# Patient Record
Sex: Female | Born: 1985 | Race: White | Hispanic: No | Marital: Married | Smoking: Former smoker
Health system: Southern US, Community
[De-identification: ages and names within clinical notes are randomized; demographics above are authoritative.]

## PROBLEM LIST (undated history)

## (undated) DIAGNOSIS — F319 Bipolar disorder, unspecified: Secondary | ICD-10-CM

## (undated) DIAGNOSIS — Z9109 Other allergy status, other than to drugs and biological substances: Secondary | ICD-10-CM

## (undated) DIAGNOSIS — IMO0001 Reserved for inherently not codable concepts without codable children: Secondary | ICD-10-CM

## (undated) DIAGNOSIS — I498 Other specified cardiac arrhythmias: Secondary | ICD-10-CM

## (undated) DIAGNOSIS — G90A Postural orthostatic tachycardia syndrome (POTS): Secondary | ICD-10-CM

## (undated) DIAGNOSIS — R000 Tachycardia, unspecified: Secondary | ICD-10-CM

## (undated) DIAGNOSIS — I951 Orthostatic hypotension: Secondary | ICD-10-CM

## (undated) HISTORY — PX: TONSILLECTOMY: SUR1361

## (undated) HISTORY — DX: Bipolar disorder, unspecified: F31.9

## (undated) HISTORY — DX: Orthostatic hypotension: I95.1

## (undated) HISTORY — DX: Other specified cardiac arrhythmias: I49.8

## (undated) HISTORY — DX: Postural orthostatic tachycardia syndrome (POTS): G90.A

## (undated) HISTORY — DX: Tachycardia, unspecified: R00.0

## (undated) HISTORY — DX: Other allergy status, other than to drugs and biological substances: Z91.09

## (undated) HISTORY — DX: Reserved for inherently not codable concepts without codable children: IMO0001

---

## 2009-03-09 ENCOUNTER — Encounter: Payer: Self-pay | Admitting: Cardiology

## 2009-04-09 ENCOUNTER — Encounter: Payer: Self-pay | Admitting: Cardiology

## 2009-05-24 ENCOUNTER — Ambulatory Visit: Payer: Self-pay | Admitting: Cardiology

## 2009-05-24 DIAGNOSIS — R002 Palpitations: Secondary | ICD-10-CM

## 2009-05-24 DIAGNOSIS — O9989 Other specified diseases and conditions complicating pregnancy, childbirth and the puerperium: Secondary | ICD-10-CM

## 2009-05-24 DIAGNOSIS — G930 Cerebral cysts: Secondary | ICD-10-CM

## 2009-05-24 DIAGNOSIS — R55 Syncope and collapse: Secondary | ICD-10-CM

## 2009-05-25 ENCOUNTER — Encounter (INDEPENDENT_AMBULATORY_CARE_PROVIDER_SITE_OTHER): Payer: Self-pay | Admitting: *Deleted

## 2009-05-31 ENCOUNTER — Encounter: Payer: Self-pay | Admitting: Cardiology

## 2009-05-31 ENCOUNTER — Ambulatory Visit: Payer: Self-pay | Admitting: Cardiology

## 2009-06-07 ENCOUNTER — Encounter: Payer: Self-pay | Admitting: Cardiology

## 2009-06-09 ENCOUNTER — Encounter (INDEPENDENT_AMBULATORY_CARE_PROVIDER_SITE_OTHER): Payer: Self-pay | Admitting: *Deleted

## 2009-06-27 ENCOUNTER — Encounter: Payer: Self-pay | Admitting: Cardiology

## 2009-07-06 ENCOUNTER — Ambulatory Visit: Payer: Self-pay | Admitting: Cardiology

## 2009-10-17 ENCOUNTER — Encounter: Payer: Self-pay | Admitting: Cardiology

## 2009-10-26 ENCOUNTER — Encounter: Payer: Self-pay | Admitting: Cardiology

## 2009-12-08 ENCOUNTER — Ambulatory Visit: Payer: Self-pay | Admitting: Cardiology

## 2010-02-15 NOTE — Assessment & Plan Note (Signed)
Summary: 3 MO FUL   Visit Type:  Follow-up Primary Majed Pellegrin:  Tanja Port, FNP   History of Present Illness: Got C section,  breast feeding with beta blocker. 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 Stopped breast feeding. Fast heart rates better. Standing up feels dizzy. Sometimes hot flashes.   the patient was previously evaluated for episodes of syncope and a prior history of cardiac murmur with possible small VSD. However an echocardiogram failed to demonstrate VSD and the patient normal LV function with no structural abnormalities. Clinically prepartum she did appear to have POTS. We encouraged fluid intake and eventually the third trimester we started her on beta blocker therapy. Clinically this has helped her significantly but as outlined above she's continued use of beta blocker while breast-feeding. There was a baby developed some hypoglycemia from this she will somewhat small at birth with some concern of IUG retardation. However the baby is developing normally now.  Preventive Screening-Counseling & Management  Alcohol-Tobacco     Smoking Status: quit < 6 months  Comments: Quit smoking on October 17, 2009  Current Medications (verified): 1)  Metoprolol Succinate 25 Mg Xr24h-Tab (Metoprolol Succinate) .... Take 1 Tablet By Mouth Once A Day 2)  Tylenol Extra Strength 500 Mg Tabs (Acetaminophen) .... As Needed 3)  Midodrine Hcl 2.5 Mg Tabs (Midodrine Hcl) .... Take 1 Tablet By Mouth Two Times A Day  Allergies: 1)  ! Pcn 2)  ! * Ivp Dye 3)  ! Keflex  Comments:  Nurse/Medical Assistant: The patient's medications were reviewed with the patient and were updated in the Medication List. Pt verbally confirmed medications.  Cyril Loosen, RN, BSN (December 08, 2009 10:25 AM)  Past History:  Past Surgical History: Last updated: 05/24/2009 Tonsillectomy  Family History: Last updated: 05/24/2009 noncontributory  Social History: Last updated: 05/24/2009 Tobacco Use - Yes.    Risk Factors: Smoking Status: quit < 6 months (12/08/2009) Packs/Day: 1 PPD (07/06/2009)  Past Medical History: Asthma Enviromental Allergies normal echocardiogram with no evidence of VSD and normal LV function POTS  Social History: Smoking Status:  quit < 6 months  Review of Systems  The patient denies fatigue, malaise, fever, weight gain/loss, vision loss, decreased hearing, hoarseness, chest pain, shortness of breath, prolonged cough, wheezing, sleep apnea, coughing up blood, abdominal pain, blood in stool, nausea, vomiting, diarrhea, heartburn, incontinence, blood in urine, muscle weakness, joint pain, leg swelling, rash, skin lesions, headache, fainting, depression, anxiety, enlarged lymph nodes, easy bruising or bleeding, environmental allergies, palpitations, and dizziness.    Vital Signs:  Patient profile:   25 year old female Height:      67 inches Weight:      257 pounds Pulse rate:   76 / minute Pulse (ortho):   96 / minute BP sitting:   109 / 67  (left arm) BP standing:   106 / 74 Cuff size:   large  Vitals Entered By: Cyril Loosen, RN, BSN (December 08, 2009 10:22 AM)  Serial Vital Signs/Assessments:  Time      Position  BP       Pulse  Resp  Temp     By 11:23 AM  Lying RA  115/77   [address], LPN 78:46 AM  Sitting   118/80   76                    Inocencio Homes  Hill, LPN 95:28 AM  Standing  106/74   [address] LPN  Comments: 41:32 AM after 3 min:  102/72  99 after 5 min:  109/77  91  By: Hoover Brunette, LPN   Comments Follow up visit. Pt states occasional episodes near-syncope. Denies any syncope. States she forgot to take her meds for a couple of days and near-syncope was worse.    Physical Exam  Additional Exam:  General: Well-developed, well-nourished in no distress head: Normocephalic and atraumatic eyes PERRLA/EOMI intact, conjunctiva and lids normal nose: No deformity or lesions mouth normal dentition,  normal posterior pharynx neck: Supple, no JVD.  No masses, thyromegaly or abnormal cervical nodes lungs: Normal breath sounds bilaterally without wheezing.  Normal percussion heart: regular rate and rhythm with normal S1 and S2, no S3 or S4.  PMI is normal.  No pathological murmurs abdomen: Normal bowel sounds, abdomen is soft and nontender without masses, organomegaly or hernias noted.  No hepatosplenomegaly. Abdomen distended consistent with normal pregnancy musculoskeletal: Back normal, normal gait muscle strength and tone normal pulsus: Pulse is normal in all 4 extremities Extremities: No peripheral pitting edema neurologic: Alert and oriented x 3 skin: Intact without lesions or rashes cervical nodes: No significant adenopathy psychologic: Normal affect    Impression & Recommendations:  Problem # 1:  PREGNANCY, NORMAL (ICD-V22.2) the patient status post delivery.  Problem # 2:  SYNCOPE AND COLLAPSE (ICD-780.2) the patient was still orthostatic in the office today with a significant increase in heart rate and symptoms of dizziness when sitting up. It was also dropped her blood pressure of 10 points. A strong recommend patient increase her fluid intake we will continue the low dose beta blocker I will also add a prescription of Midodrin 2.5 mg p.o. b.i.d. again I made it very clear to the patient that she cannot breast-feed any of these medications. Her updated medication list for this problem includes:    Metoprolol Succinate 25 Mg Xr24h-tab (Metoprolol succinate) .[name]... Take 1 tablet by mouth once a day  Problem # 3:  PALPITATIONS (ICD-785.1) improve significantly on metoprolol. Her updated medication list for this problem includes:    Metoprolol Succinate 25 Mg Xr24h-tab (Metoprolol succinate) .[name]... Take 1 tablet by mouth once a day  Problem # 4:  OTH GENERAL MEDICAL EXAMINATION ADMIN PURPOSES (ICD-V70.3) the patient given her medical condition needs to take both her beta blocker  and newly prescribed vasoconstrictor. 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 This note is to confirm that the patient is in need of these medications isn't currently required for approval of payment through insurance.  Patient Instructions: 1)  Midodrin 2.5mg  two times a day - no breast feeding on this med 2)  Encourage fluids 3)  Follow up in  3 months Prescriptions: MIDODRINE HCL 2.5 MG TABS (MIDODRINE HCL) Take 1 tablet by mouth two times a day  #60 x 6   Entered by:   Hoover Brunette, LPN   Authorized by:   Lewayne Bunting, MD, [facility]   Signed by:   Hoover Brunette, LPN on 44/01/270   Method used:   Electronically to        [facility]* (retail)       [address]       [place], [place]  [address]  Ph: [phone]       Fax: [phone]   RxID:   [id number]   Handout requested.

## 2010-02-15 NOTE — Letter (Signed)
Summary: Generic Engineer, agricultural at [facility] [address]   [place], [place] [address]   Phone: [phone]  Fax: [phone]    05/25/2009  Jenya BIXBY [address] [place], [place]  [address]  Dear Ms. BIXBY,     Enclosed is a copy of the order for your 2 D ECHO that was ordered by Dr. Andee Lineman. I have been unable to reach you by telephone. If this date and time are not suitable  please call our office at [phone] and we will be happy  to re-schedule this for you.        Sincerely,   Zachary George

## 2010-02-15 NOTE — Letter (Signed)
Summary: Appointment -missed  [facility] [address]   [place], [place] [address]   Phone: [phone]  Fax: [phone]     October 26, 2009 MRN: [id number]     [facility] [address] [place], [place]  [address]     Dear Ms. BIXBY,  Our records indicate you missed your appointment on October 26, 2009                        with Dr.  Andee Lineman.   It is very important that we reach you to reschedule this appointment. We look forward to participating in your health care needs.   Please contact us at the number listed above at your earliest convenience to reschedule this appointment.   Sincerely,    Glass blower/designer

## 2010-02-15 NOTE — Assessment & Plan Note (Signed)
Summary: NP-SYNCOPAL EPISODES W/CP   Visit Type:  Initial Consult Primary Provider:  Tanja Port, FNP  CC:  syncope.  History of Present Illness: the patient is a 25 year old female with a prior history of cardiac murmur and possible small VSD. The patient has moved from [place] where she had an echocardiogram 2 years ago. Reportedly there was no residual heart defect per verbal report from her mother. The patient is now in her fifth month of her first pregnancy. The patient has had several syncopal episodes over the last month and a half. The episodes are preceded by a sensation of feeling hot all over, nauseated and occurred upon prolonged standing. She initially also experiences significant shortness of breath with tightness across his sternum. There is no wheezing. There is no diaphoresis. The patient describes several episodes her most recent one occurred at [facility] when she was standing in line at a cash register. She also had an episodes of taking a prolonged shower. During her last episode she stated she fell forward she did not have prodrome of nausea, a sensation of feeling hot or dizziness. During the other episodes she had all the prodrome just outlined.  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 Prolonged standing particularly hot weather would make her feel very dizzy and near syncopal episodes. Her mother reports that she has struggled with this for many years. She has never been formally diagnosed with POTS. Her symptoms have now exacerbated during her pregnancy. Unfortunate patient drinks a lot of coffee particularly at night as well as other caffeinated beverages like [facility] and sweetened ice tea.  Preventive Screening-Counseling & Management  Alcohol-Tobacco     Smoking Status: current     Smoking Cessation Counseling: yes     Packs/Day: 1 PPD  Allergies (verified): 1)  ! Pcn 2)  ! * Ivp Dye 3)  ! Keflex  Past History:  Past  Medical History: Last updated: 05/24/2009 Asthma Enviromental Allergies  Past Surgical History: Last updated: 05/24/2009 Tonsillectomy   Family History: Reviewed history and no changes required. noncontributory  Social History: Reviewed history and no changes required. Tobacco Use - Yes.  Smoking Status:  current Packs/Day:  1 PPD  Review of Systems       The patient complains of weight gain/loss, shortness of breath, nausea, and skin lesions.  The patient denies fatigue, malaise, fever, vision loss, decreased hearing, hoarseness, chest pain, palpitations, prolonged cough, wheezing, sleep apnea, coughing up blood, abdominal pain, blood in stool, vomiting, diarrhea, heartburn, incontinence, blood in urine, muscle weakness, joint pain, leg swelling, rash, headache, fainting, dizziness, depression, anxiety, enlarged lymph nodes, easy bruising or bleeding, and environmental allergies.    Vital Signs:  Patient profile:   25 year old female Height:      67 inches Weight:      260 pounds BMI:     40.87 Pulse rate:   92 / minute Pulse (ortho):   98 / minute BP sitting:   101 / 68  (left arm) BP standing:   103 / 71 Cuff size:   large  Vitals Entered By: Carlye Grippe (May 24, 2009 2:38 PM)  Nutrition Counseling: Patient's BMI is greater than 25 and therefore counseled on weight management options. CC: syncope   Serial Vital Signs/Assessments:  Time      Position  BP       Pulse  Resp  Temp     By 2:41 PM   Lying  LA  92/59    74                    Carlye Grippe 2:41 PM   Sitting   101/68   92                    Carlye Grippe 2:41 PM   Standing  103/71   98                    Carlye Grippe 2:44 PM   Standing  113/78   93                    Carlye Grippe 2:46 PM   Standing  100/70   102                   Carlye Grippe   Physical Exam  Additional Exam:  General: Well-developed, well-nourished in no distress head: Normocephalic and atraumatic eyes PERRLA/EOMI intact,  conjunctiva and lids normal nose: No deformity or lesions mouth normal dentition, normal posterior pharynx neck: Supple, no JVD.  No masses, thyromegaly or abnormal cervical nodes lungs: Normal breath sounds bilaterally without wheezing.  Normal percussion heart: regular rate and rhythm with normal S1 and S2, no S3 or S4.  PMI is normal.  No pathological murmurs abdomen: Normal bowel sounds, abdomen is soft and nontender without masses, organomegaly or hernias noted.  No hepatosplenomegaly. Abdomen distended consistent with normal pregnancy musculoskeletal: Back normal, normal gait muscle strength and tone normal pulsus: Pulse is normal in all 4 extremities Extremities: No peripheral pitting edema neurologic: Alert and oriented x 3 skin: Intact without lesions or rashes cervical nodes: No significant adenopathy psychologic: Normal affect    EKG  Procedure date:  05/24/2009  Findings:      normal sinus rhythm with sinus arrhythmia. Low voltage QRS. Heart rate 73 beats per minute.  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 I also think at baseline and to date in her pregnancy the patient has a history that is consistent with POTS. She is typically not well hydrated and uses a significant amount of caffeinated beverages. I instructed her to drink G2 and a string from other caffeinated beverages. I recommended 48-64 ounces a day. The patient also is advised not to assume a position of prolonged standing. I also told her to immediately down on the floor if she feels any symptoms of dizziness, heat or nausea. Anticipate with adequate hydration that we can ameliorate her problem. Other options would include a low dose of Florinef although this will need to be discussed with OB/GYN, and/or possible low dose propranolol. Given her prior  history of possible ventricular septal defect we will get an echocardiogram and also obtain a cardiac monitor to make sure the patient does not have any significant arrhythmias. Orthostatic heart rates and blood pressure in the office was consistent with a postural orthostatic tachycardia syndrome. I gave the patient printed handouts.I do not think the patient has clinical evidence of a pulmonary embolism.the patient was instructed not to drive. Orders: Cardionet/Event Monitor (Cardionet/Event) 2-D Echocardiogram (2D Echo)  Problem # 2:  PALPITATIONS (ICD-785.1) likely occurring in the setting off POTS, but according to mother has been ordered. Orders: Cardionet/Event Monitor (Cardionet/Event) 2-D Echocardiogram (2D Echo)  Problem # 3:  PREGNANCY, NORMAL (ICD-V22.2)  The patient is followed by OB/GYN. Currently she appears to have a normal pregnancy  Other Orders: EKG w/ Interpretation (93000)  Patient Instructions: 1)  2D Echo  2)  Cardionet x 21 days  3)  Follow up in  1 month

## 2010-02-15 NOTE — Letter (Signed)
Summary: External Correspondence/ [facility]  External Correspondence/ [facility]   Imported By: Dorise Hiss 04/28/2009 15:46:07  _____________________________________________________________________  External Attachment:    Type:   Image     Comment:   External Document

## 2010-02-15 NOTE — Assessment & Plan Note (Signed)
Summary: 1 mo fu for cardionet & echo-srs   Visit Type:  Follow-up Primary Provider:  Tanja Port, FNP   History of Present Illness: the patient is a 25 year old pregnant female now in her third trimester.  The patient was initially evaluated for several episodes of syncope over the last month and a half.just prior history of cardiac murmur and possible small VSD.  However a recent echocardiogram failed to demonstrate VSD and the patient had normal LV function with no structural cardiac abnormalities.  Chest history of palpitations or cardiac monitor showed no significant arrhythmias.  Clinically prepartum she appeared to have POTS.  We encouraged increased fluid intake in the last office visit.  She has done so and is feeling better.  She has had no true syncope.  She does have a episodes of dizziness.  The patient is now in the third trimester and I told her to safely use a beta-blocker.  I confirmed this with the patient's OB/GYN physician.  Last office visit we did have a documented heart rate with an increase from 74 beats/min 202 beats/min standing.  In the interim the patient reports no chest pain shortness of breath orthopnea PND  Preventive Screening-Counseling & Management  Alcohol-Tobacco     Smoking Status: current     Smoking Cessation Counseling: yes     Packs/Day: 1 PPD  Current Problems (verified): 1)  Pregnancy, Normal  (ICD-V22.2) 2)  Pregnancy, Adolescent  (ICD-659.83) 3)  Palpitations  (ICD-785.1) 4)  Syncope and Collapse  (ICD-780.2) 5)  Other Specifed Complication Antepartum  (EAV-409.81) 6)  Cerebral Cysts  (ICD-348.0)  Current Medications (verified): 1)  Prenavite Multiple Vitamin 28-0.8 Mg Tabs (Prenatal Vit-Fe Fumarate-Fa) .... Take 1 Tablet By Mouth Once A Day 2)  Ventolin Hfa 108 (90 Base) Mcg/act Aers (Albuterol Sulfate) .... Two Puffs Every Four Hours As Needed 3)  Metoprolol Succinate 25 Mg Xr24h-Tab (Metoprolol Succinate) .... Take 1 Tablet By Mouth Once A  Day  Allergies (verified): 1)  ! Pcn 2)  ! * Ivp Dye 3)  ! Keflex  Comments:  Nurse/Medical Assistant: The patient's medications and allergies were verbally reviewed with the patient and were updated in the Medication and Allergy Lists.  Past History:  Past Medical History: Last updated: 05/24/2009 Asthma Enviromental Allergies  Past Surgical History: Last updated: 05/24/2009 Tonsillectomy  Family History: Last updated: 05/24/2009 noncontributory  Social History: Last updated: 05/24/2009 Tobacco Use - Yes.   Risk Factors: Smoking Status: current (07/06/2009) Packs/Day: 1 PPD (07/06/2009)  Review of Systems       The patient complains of weight gain/loss, dizziness, and anxiety.  The patient denies fatigue, malaise, fever, vision loss, decreased hearing, hoarseness, chest pain, palpitations, shortness of breath, prolonged cough, wheezing, sleep apnea, coughing up blood, abdominal pain, blood in stool, nausea, vomiting, diarrhea, heartburn, incontinence, blood in urine, muscle weakness, joint pain, leg swelling, rash, skin lesions, headache, fainting, depression, enlarged lymph nodes, easy bruising or bleeding, and environmental allergies.    Vital Signs:  Patient profile:   25 year old female Height:      67 inches Weight:      272 pounds Pulse rate:   91 / minute BP sitting:   108 / 72  (left arm) Cuff size:   large  Vitals Entered By: Carlye Grippe (July 06, 2009 9:33 AM)  Physical Exam  Additional Exam:  General: Well-developed, well-nourished in no distress head: Normocephalic and atraumatic eyes PERRLA/EOMI intact, conjunctiva and lids normal nose: No deformity or lesions  mouth normal dentition, normal posterior pharynx neck: Supple, no JVD.  No masses, thyromegaly or abnormal cervical nodes lungs: Normal breath sounds bilaterally without wheezing.  Normal percussion heart: regular rate and rhythm with normal S1 and S2, no S3 or S4.  PMI is normal.  No  pathological murmurs abdomen: Normal bowel sounds, abdomen is soft and nontender without masses, organomegaly or hernias noted.  No hepatosplenomegaly. Abdomen distended consistent with normal pregnancy musculoskeletal: Back normal, normal gait muscle strength and tone normal pulsus: Pulse is normal in all 4 extremities Extremities: No peripheral pitting edema neurologic: Alert and oriented x 3 skin: Intact without lesions or rashes cervical nodes: No significant adenopathy psychologic: Normal affect    Impression & Recommendations:  Problem # 1:  PREGNANCY, NORMAL (ICD-V22.2)  Problem # 2:  SYNCOPE AND COLLAPSE (ICD-780.2) probable POTS .  Will start low-dose beta-blocker Toprol ER 25 milligram two dailyno further indication progressive therapy during her pregnancy.  After pregnancy we will evaluate the patient's weight supine and standing norepinephrine levels.  As well serum Aldactone and 24-hour cortisol collection. Her updated medication list for this problem includes:    Metoprolol Succinate 25 Mg Xr24h-tab (Metoprolol succinate) .[name]... Take 1 tablet by mouth once a day  Problem # 3:  PALPITATIONS (ICD-785.1) Assessment: Comment Only  Her updated medication list for this problem includes:    Metoprolol Succinate 25 Mg Xr24h-tab (Metoprolol succinate) .[name]... Take 1 tablet by mouth once a day  Patient Instructions: 1)  Metoprolol ER 25mg  daily 2)  Follow up in  3 months Prescriptions: METOPROLOL SUCCINATE 25 MG XR24H-TAB (METOPROLOL SUCCINATE) Take 1 tablet by mouth once a day  #30 x 6   Entered by:   Hoover Brunette, LPN   Authorized by:   Lewayne Bunting, MD, [facility]   Signed by:   Hoover Brunette, LPN on 81/19/1478   Method used:   Electronically to        [facility]* (retail)       [address]       [place], [place]  [address]       Ph: [phone]       Fax: [phone]   RxID:   [id number]   Handout requested.

## 2010-02-15 NOTE — Consult Note (Signed)
Summary: Consultation Report/ MATERNAL/FETAL ASSESSMENT REPORT  Consultation Report/ MATERNAL/FETAL ASSESSMENT REPORT   Imported By: Dorise Hiss 07/06/2009 08:39:18  _____________________________________________________________________  External Attachment:    Type:   Image     Comment:   External Document

## 2010-02-15 NOTE — Letter (Signed)
Summary: Engineer, materials at [facility]  [address]   [place], [place] [address]   Phone: [phone]  Fax: [phone]        Jun 09, 2009 MRN: [id number]   [facility] [address] [place], [place]  [address]   Dear Ms. BIXBY,  Your test ordered by Selena Batten has been reviewed by your physician (or physician assistant) and was found to be normal or stable. Your physician (or physician assistant) felt no changes were needed at this time.  __X__ Echocardiogram  ____ Cardiac Stress Test  ____ Lab Work  ____ Peripheral vascular study of arms, legs or neck  ____ CT scan or X-ray  ____ Lung or Breathing test  ____ Other:   Thank you.   Hoover Brunette, LPN    Duane Boston, M.D., F.A.C.C. Thressa Sheller, M.D., F.A.C.C. Oneal Grout, M.D., F.A.C.C. Cheree Ditto, M.D., F.A.C.C. Daiva Nakayama, M.D., F.A.C.C. Kenney Houseman, M.D., F.A.C.C. Jeanne Ivan, PA-C

## 2010-02-15 NOTE — Procedures (Signed)
Summary: Event Final Summary  Event Final Summary   Imported By: Cyril Loosen, RN, BSN 07/06/2009 14:56:18  _____________________________________________________________________  External Attachment:    Type:   Image     Comment:   External Document

## 2010-06-27 ENCOUNTER — Other Ambulatory Visit: Payer: Self-pay | Admitting: *Deleted

## 2010-06-27 MED ORDER — MIDODRINE HCL 2.5 MG PO TABS: 2.5000 mg | ORAL_TABLET | Freq: Three times a day (TID) | ORAL | Status: AC

## 2010-06-27 MED ORDER — METOPROLOL SUCCINATE ER 25 MG PO TB24: 25.0000 mg | ORAL_TABLET | Freq: Every day | ORAL | Status: DC

## 2010-06-29 ENCOUNTER — Encounter: Payer: Self-pay | Admitting: Cardiovascular Disease

## 2010-07-28 ENCOUNTER — Encounter: Payer: Self-pay | Admitting: Cardiovascular Disease

## 2010-08-05 ENCOUNTER — Ambulatory Visit (INDEPENDENT_AMBULATORY_CARE_PROVIDER_SITE_OTHER): Payer: Medicaid Other | Admitting: Cardiovascular Disease

## 2010-08-05 ENCOUNTER — Encounter: Payer: Self-pay | Admitting: Cardiovascular Disease

## 2010-08-05 VITALS — BP 108/73 | HR 58 | Ht 67.0 in | Wt 270.0 lb

## 2010-08-05 DIAGNOSIS — R55 Syncope and collapse: Secondary | ICD-10-CM

## 2010-08-05 DIAGNOSIS — R002 Palpitations: Secondary | ICD-10-CM

## 2010-08-05 MED ORDER — MIDODRINE HCL 2.5 MG PO TABS: 2.5000 mg | ORAL_TABLET | Freq: Three times a day (TID) | ORAL | Status: DC

## 2010-08-05 MED ORDER — METOPROLOL SUCCINATE ER 25 MG PO TB24: 25.0000 mg | ORAL_TABLET | Freq: Every day | ORAL | Status: DC

## 2010-08-05 NOTE — Patient Instructions (Signed)
Your physician wants you to follow-up in: 6 months. You will receive a reminder letter in the mail one-two months in advance. If you don't receive a letter, please call our office to schedule the follow-up appointment. Your physician recommends that you continue on your current medications as directed. Please refer to the Current Medication list given to you today. 

## 2010-08-05 NOTE — Assessment & Plan Note (Signed)
The patient has a history of neurocardiogenic syncope as well as POTS. This is being treated with Toprol as well as Midodrin. Her symptoms have improved significantly. She has not had any syncope or presyncope since her last visit. Her dizziness also improved. Thus, we'll continue with current medications. Midorin will be continued at a dose of 2.5 mg 3 times daily. She will continue to ensure oral hydration and avoid sudden standing up. She'll followup in 6 months from now or earlier if needed.

## 2010-08-05 NOTE — Progress Notes (Signed)
HPI  This is a 25 year old female who is here today for a followup visit. She is a patient of Dr. Andee Lineman. She has a history of her previous small VSD but subsequent to echocardiogram showed no evidence of this. She was evaluated for syncope and orthostatic dizziness during pregnancy. She was diagnosed with neurocardiogenic syncope as well as POTS. She was initially started on Toprol-XL 25 mg once daily but continued to have symptoms of orthostatic dizziness. Thus, she was started on Midodrin 2.5 mg twice daily. She delivered without complications. Currently she actually feels much better. She has not had any syncopal or presyncopal episodes. Her dizziness has improved significantly. Her palpitations are also better. She quit smoking since her last visit.  Allergies  Allergen Reactions  . Cephalexin   . Penicillins      Current Outpatient Prescriptions on File Prior to Visit  Medication Sig Dispense Refill  . metoprolol succinate (TOPROL XL) 25 MG 24 hr tablet Take 1 tablet (25 mg total) by mouth daily.  30 tablet  1     Past Medical History  Diagnosis Date  . Asthma   . Environmental allergies   . POTS (postural orthostatic tachycardia syndrome)   . Normal echocardiogram     w/no evidence of VSD and normal LV function     Past Surgical History  Procedure Date  . Tonsillectomy      No family history on file.   History   Social History  . Marital Status: Single    Spouse Name: N/A    Number of Children: N/A  . Years of Education: N/A   Occupational History  . Not on file.   Social History Main Topics  . Smoking status: Former Smoker    Types: Cigarettes    Quit date: 10/17/2009  . Smokeless tobacco: Never Used  . Alcohol Use: No  . Drug Use: No  . Sexually Active: Not on file   Other Topics Concern  . Not on file   Social History Narrative  . No narrative on file       PHYSICAL EXAM   BP 108/73  Pulse 58  Ht 5\' 7"  (1.702 m)  Wt 270 lb (122.471 kg)   BMI 42.29 kg/m2  Constitutional: She is oriented to person, place, and time. She appears well-developed and well-nourished. No distress.  HENT: No nasal discharge.  Head: Normocephalic and atraumatic.  Eyes: Pupils are equal, round, and reactive to light. Right eye exhibits no discharge. Left eye exhibits no discharge.  Neck: Normal range of motion. Neck supple. No JVD present. No thyromegaly present.  Cardiovascular: Normal rate, regular rhythm, normal heart sounds and intact distal pulses. Exam reveals no gallop and no friction rub.  No murmur heard.  Pulmonary/Chest: Effort normal and breath sounds normal. No stridor. No respiratory distress. She has no wheezes. She has no rales. She exhibits no tenderness.  Abdominal: Soft. Bowel sounds are normal. She exhibits no distension. There is no tenderness. There is no rebound and no guarding.  Musculoskeletal: Normal range of motion. She exhibits no edema and no tenderness.  Neurological: She is alert and oriented to person, place, and time. Coordination normal.  Skin: Skin is warm and dry. No rash noted. She is not diaphoretic. No erythema. No pallor.  Psychiatric: She has a normal mood and affect. Her behavior is normal. Judgment and thought content normal.     EKG: Sinus bradycardia with no significant ST or T wave changes. Normal QT interval.  ASSESSMENT AND PLAN

## 2011-03-06 ENCOUNTER — Encounter: Payer: Self-pay | Admitting: Cardiology

## 2011-03-06 ENCOUNTER — Ambulatory Visit (INDEPENDENT_AMBULATORY_CARE_PROVIDER_SITE_OTHER): Payer: Medicaid Other | Admitting: Cardiology

## 2011-03-06 VITALS — BP 127/86 | HR 101 | Ht 67.0 in | Wt 314.0 lb

## 2011-03-06 DIAGNOSIS — J45909 Unspecified asthma, uncomplicated: Secondary | ICD-10-CM | POA: Insufficient documentation

## 2011-03-06 DIAGNOSIS — R002 Palpitations: Secondary | ICD-10-CM

## 2011-03-06 DIAGNOSIS — IMO0001 Reserved for inherently not codable concepts without codable children: Secondary | ICD-10-CM | POA: Insufficient documentation

## 2011-03-06 DIAGNOSIS — F319 Bipolar disorder, unspecified: Secondary | ICD-10-CM | POA: Insufficient documentation

## 2011-03-06 DIAGNOSIS — Z9109 Other allergy status, other than to drugs and biological substances: Secondary | ICD-10-CM | POA: Insufficient documentation

## 2011-03-06 MED ORDER — PROPRANOLOL HCL 20 MG PO TABS: ORAL_TABLET | ORAL | Status: DC

## 2011-03-06 NOTE — Patient Instructions (Signed)
   Propranolol 20mg  - may take one tab every 12 hours as needed palpitaions / tachycardia Your physician wants you to follow up in: 6 months.  You will receive a reminder letter in the mail one-two months in advance.  If you don't receive a letter, please call our office to schedule the follow up appointment

## 2011-03-06 NOTE — Progress Notes (Signed)
Suzanne Bottoms, MD, [facility] ABIM Board Certified in Adult Cardiovascular Medicine,Internal Medicine and Critical Care Medicine    CC: Follow patient with palpitations and POTS  HPI:  The patient is doing well from a cardiovascular perspective. She has a prior history of small VSD but followup echocardiogram showed no evidence of this. In the past she had syncope and orthostatic dizziness which was felt to be secondary to POTS syndrome. She has had no recurrence of low dose beta blocker therapy and ProAmatine. Also orthostatic blood pressures in the office today were within normal limits. She only reports slight dizziness when standing up too quickly. The patient is currently taking lamotrigine because of episodes of mania and depression. She states that she has been diagnosed with this problem since age 21. She has been on multiple medications including valproic acid and lithium. She states that she has a lot of episodes of anger and then feels her heart racing. When her mood is stable she does not report palpitations.   PMH: reviewed and listed in Problem List in Electronic Records (and see below) Past Medical History  Diagnosis Date  . Asthma     No wheezing tolerating beta blocker  . Environmental allergies   . POTS (postural orthostatic tachycardia syndrome)     Improved significantly with low dose beta blocker and proamatine  . Normal echocardiogram     w/no evidence of VSD and normal LV function  . Bipolar disorder    Past Surgical History  Procedure Date  . Tonsillectomy     Allergies/SH/FHX : available in Electronic Records for review  Allergies  Allergen Reactions  . Cephalexin   . Penicillins    History   Social History  . Marital Status: Married    Spouse Name: N/A    Number of Children: N/A  . Years of Education: N/A   Occupational History  . Not on file.   Social History Main Topics  . Smoking status: Former Smoker    Types: Cigarettes    Quit date:  10/17/2009  . Smokeless tobacco: Never Used  . Alcohol Use: No  . Drug Use: No  . Sexually Active: Not on file   Other Topics Concern  . Not on file   Social History Narrative  . No narrative on file   No family history on file.  Medications: Current Outpatient Prescriptions  Medication Sig Dispense Refill  . lamoTRIgine (LAMICTAL) 100 MG tablet Take 100 mg by mouth daily.        . metoprolol succinate (TOPROL XL) 25 MG 24 hr tablet Take 1 tablet (25 mg total) by mouth daily.  30 tablet  6  . midodrine (PROAMATINE) 2.5 MG tablet Take 1 tablet (2.5 mg total) by mouth 3 (three) times daily.  90 tablet  6  . norethindrone-ethinyl estradiol (ORTHO-NOVUM 1-35 TAB,NORTREL 1-35 TAB) 1-35 MG-MCG per tablet Take 1 tablet by mouth daily.        . phentermine 37.5 MG capsule Take 37.5 mg by mouth every morning.      . propranolol (INDERAL) 20 MG tablet May take one tab every 12 hours as needed for palpitations / tachycardia  30 tablet  1    ROS: No nausea or vomiting. No fever or chills.No melena or hematochezia.No bleeding.No claudication  Physical Exam: BP 127/86  Pulse 101  Ht 5\' 7"  (1.702 m)  Wt 314 lb (142.429 kg)  BMI 49.18 kg/m2 General: Overweight white female Neck: Normal carotid upstroke no carotid bruits.  No thyromegaly nonnodular thyroid Lungs: Clear breath sounds bilaterally Cardiac: Regular rate and rhythm with normal S1-S2 Vascular: No edema. Skin: Warm and dry Physcologic: Normal affect in the office today  12lead ECG: Normal sinus rhythm no acute ischemic changes. Limited bedside ECHO:N/A   Patient Active Problem List  Diagnoses  . CEREBRAL CYSTS  . PREGNANCY, ADOLESCENT  . PALPITATIONS-mainly during periods of mania   . Asthma  . Environmental allergies  . POTS (postural orthostatic tachycardia syndrome)  . Normal echocardiogram  . Bipolar disorder-on lamotrigine under the care of psychiatry     PLAN   The patient has had no recurrent episodes of  syncope or presyncope. She can continue on her current medical regimen of a beta blocker and ProAmatine.  She also has been started apparently on phentermine which is not causing her to have palpitations.  She does report palpitations and tachycardia during episodes of what appeared to be mania. I've given her a prescription for propranolol that she can take when necessary 20 mg up to a maximal dose of 2 pills a day if she finds that she is uncomfortable palpitations during these rages.

## 2011-03-08 ENCOUNTER — Ambulatory Visit: Payer: Medicaid Other | Admitting: Cardiology

## 2011-04-17 ENCOUNTER — Other Ambulatory Visit: Payer: Self-pay | Admitting: *Deleted

## 2011-04-17 MED ORDER — METOPROLOL SUCCINATE ER 25 MG PO TB24: 25.0000 mg | ORAL_TABLET | Freq: Every day | ORAL | Status: DC

## 2011-06-19 ENCOUNTER — Other Ambulatory Visit: Payer: Self-pay | Admitting: Cardiovascular Disease

## 2011-09-14 ENCOUNTER — Ambulatory Visit: Payer: Medicaid Other | Admitting: Cardiology

## 2011-09-15 ENCOUNTER — Ambulatory Visit: Payer: Medicaid Other | Admitting: Cardiology

## 2011-10-10 ENCOUNTER — Other Ambulatory Visit: Payer: Self-pay | Admitting: Cardiology

## 2012-01-01 ENCOUNTER — Other Ambulatory Visit: Payer: Self-pay | Admitting: Physician Assistant

## 2012-01-03 ENCOUNTER — Encounter: Payer: Self-pay | Admitting: Physician Assistant

## 2012-01-26 ENCOUNTER — Telehealth: Payer: Self-pay | Admitting: Cardiology

## 2012-01-26 NOTE — Telephone Encounter (Signed)
Call patient back to schedule an appointment w/ Dr. Keturah Barre number to call her back on is [phone].

## 2012-01-31 ENCOUNTER — Ambulatory Visit: Payer: Medicaid Other | Admitting: Cardiology

## 2012-02-02 ENCOUNTER — Other Ambulatory Visit: Payer: Self-pay | Admitting: Cardiology

## 2012-02-02 MED ORDER — METOPROLOL SUCCINATE ER 25 MG PO TB24: 25.0000 mg | ORAL_TABLET | Freq: Every day | ORAL | Status: DC

## 2012-02-13 ENCOUNTER — Ambulatory Visit: Payer: Medicaid Other | Admitting: Cardiovascular Disease

## 2012-03-06 ENCOUNTER — Other Ambulatory Visit: Payer: Self-pay | Admitting: *Deleted

## 2012-03-06 MED ORDER — METOPROLOL SUCCINATE ER 25 MG PO TB24: 25.0000 mg | ORAL_TABLET | Freq: Every day | ORAL | Status: DC

## 2012-03-21 ENCOUNTER — Ambulatory Visit: Payer: Medicaid Other | Admitting: Physician Assistant

## 2012-04-02 ENCOUNTER — Other Ambulatory Visit: Payer: Self-pay | Admitting: Physician Assistant

## 2012-04-22 ENCOUNTER — Other Ambulatory Visit: Payer: Self-pay | Admitting: Cardiology

## 2012-04-22 MED ORDER — MIDODRINE HCL 2.5 MG PO TABS: 2.5000 mg | ORAL_TABLET | Freq: Three times a day (TID) | ORAL | Status: DC

## 2012-04-22 MED ORDER — PROPRANOLOL HCL 20 MG PO TABS: ORAL_TABLET | ORAL | Status: DC

## 2012-04-22 MED ORDER — METOPROLOL SUCCINATE ER 25 MG PO TB24: 25.0000 mg | ORAL_TABLET | Freq: Every day | ORAL | Status: DC

## 2012-06-11 ENCOUNTER — Other Ambulatory Visit: Payer: Self-pay | Admitting: Physician Assistant

## 2012-07-16 ENCOUNTER — Telehealth: Payer: Self-pay | Admitting: Physician Assistant

## 2012-07-16 MED ORDER — METOPROLOL SUCCINATE ER 25 MG PO TB24: 25.0000 mg | ORAL_TABLET | Freq: Every day | ORAL | Status: DC

## 2012-07-16 MED ORDER — PROPRANOLOL HCL 20 MG PO TABS: ORAL_TABLET | ORAL | Status: DC

## 2012-07-16 NOTE — Telephone Encounter (Signed)
Needs refill on Toprol & Propranoiol-Laynes

## 2012-08-16 ENCOUNTER — Ambulatory Visit: Payer: Medicaid Other | Admitting: Cardiovascular Disease

## 2012-08-16 ENCOUNTER — Telehealth: Payer: Self-pay

## 2012-08-16 NOTE — Telephone Encounter (Signed)
Spoke to patient regarding missed appointment.  Patient advised her daughter has a chronic condition with no immune system and was sick today, she could not come in and did apologize for not calling.    I explained our late cancellation policy to Suzanne Chambers.  I rescheduled her appointment and reminded her again to give 24 hours notice if not keeping the appointment.  She was also advised that if she did not keep her appointment, we would not be able to refill any medications for her, she understood and stated she will keep the appointment.

## 2012-08-28 ENCOUNTER — Telehealth: Payer: Self-pay | Admitting: Cardiology

## 2012-08-28 NOTE — Telephone Encounter (Signed)
Called pt to rescheduled appointment. Pt stated that she needed to be seen sooner than 09-30-2012 because she was passing out more and seeing black spots more. I informed pt that doctor was cancelling his afternoon clinic due to the hospital work being busy. Informed pt that if problem continued to go see her primary or go to the ER. Pt stated that she would call back to schedule.

## 2012-08-29 ENCOUNTER — Ambulatory Visit: Payer: Medicaid Other | Admitting: Cardiovascular Disease

## 2012-10-10 ENCOUNTER — Ambulatory Visit: Payer: Medicaid Other | Admitting: Cardiology

## 2012-10-11 ENCOUNTER — Ambulatory Visit (INDEPENDENT_AMBULATORY_CARE_PROVIDER_SITE_OTHER): Payer: Medicaid Other | Admitting: Cardiology

## 2012-10-11 VITALS — BP 117/66 | HR 94 | Ht 67.0 in | Wt 297.0 lb

## 2012-10-11 DIAGNOSIS — R002 Palpitations: Secondary | ICD-10-CM

## 2012-10-11 MED ORDER — MIDODRINE HCL 5 MG PO TABS: 5.0000 mg | ORAL_TABLET | Freq: Three times a day (TID) | ORAL | Status: AC

## 2012-10-11 MED ORDER — METOPROLOL SUCCINATE ER 50 MG PO TB24: 50.0000 mg | ORAL_TABLET | Freq: Every day | ORAL | Status: DC

## 2012-10-11 NOTE — Progress Notes (Signed)
Clinical Summary [name] is a 27 y.o.female returns for follow up appointment regarding history of POTS and neurocardiogenic syncope   1. POTS - prior history of orthostatic dizziness and neurocardiogenic syncope, had been well controlled w/ beta blocker and midodrine - palpitations often brought on by manic episodes  - symptoms can occur while sitting. Worst w/ standing. Describe black spots in vision, lightheaded/dizzy. + palps, sometimes feels heart slows down. Increased anxiety. Reports she has passed out before, last episode in June. Episodes occur 3-6 times a month  - compliant metoprolol, midodrine. Drinks water regularly, approx 1 gallon a day per her report. Drinks 1 pot of coffee a day, 8 oz glass of tea.    Past Medical History  Diagnosis Date  . Asthma     No wheezing tolerating beta blocker  . Environmental allergies   . POTS (postural orthostatic tachycardia syndrome)     Improved significantly with low dose beta blocker and proamatine  . Normal echocardiogram     w/no evidence of VSD and normal LV function  . Bipolar disorder      Allergies  Allergen Reactions  . Cephalexin   . Penicillins      Current Outpatient Prescriptions  Medication Sig Dispense Refill  . lamoTRIgine (LAMICTAL) 100 MG tablet Take 100 mg by mouth daily.        . metoprolol succinate (TOPROL-XL) 25 MG 24 hr tablet Take 1 tablet (25 mg total) by mouth daily.  30 tablet  1  . midodrine (PROAMATINE) 2.5 MG tablet Take 1 tablet (2.5 mg total) by mouth 3 (three) times daily.  90 tablet  3  . norethindrone-ethinyl estradiol (ORTHO-NOVUM 1-35 TAB,NORTREL 1-35 TAB) 1-35 MG-MCG per tablet Take 1 tablet by mouth daily.        . phentermine 37.5 MG capsule Take 37.5 mg by mouth every morning.      . propranolol (INDERAL) 20 MG tablet May take one tab every 12 hours as needed for palpitations / tachycardia  30 tablet  1   No current facility-administered medications for this visit.      Past Surgical History  Procedure Laterality Date  . Tonsillectomy       Allergies  Allergen Reactions  . Cephalexin   . Penicillins       No family history on file.   Social History Ms. Draughn reports that she quit smoking about 2 years ago. Her smoking use included Cigarettes. She smoked 0.00 packs per day. She has never used smokeless tobacco. Ms. EILERT reports that she does not drink alcohol.   Review of Systems Complete ROS negative other than reported in HPI  Physical Examination Lying p 73 bp 111/75  Sitting p 87 bp 126/94 Standing 121/76 p 97  Wt 297 lbs BMI 46 Gen: resting comfortably, NAD HEENT: no scleral icterus, pupils equal round and reactive, no palptable cervical adenopathy CV: RRR, no m/r/g, no JVD, no cartoid bruits Pulm: CTAB Abd: soft, NT, ND NABS, no hepatosplenomegaly Ext: warm, no edema.  Skin: warm, no rash Neuro: A&Ox3, no focal deficits    Diagnostic Studies 10/10/12 Clinic EKG: SR, normal axis, PR 160, QRS 80, QTc 400, LAE, no ischemic changes    Assessment and Plan  1. POTS/neurocardiogenic syncope - symptoms previously well controlled on beta blocker and midodrine, now seem to have returned. - will increase Toprol XL to 50mg  qday - increase midodrine to 5mg  tid - counseled to hydrate w/ fluids w/ electrolytes (powerade, gatorade, etc.) as  opposed to just water, cut down on coffee and tea which have too much caffeine and serve as diuretics for her - counseled and agrees to stop driving until her symptoms are stabilized. - notes some mild LE edema at times which is new, prior notes mention a small VSD seen on prior echoes, will repeat 2D echo    [name], M.D., F.A.C.C.

## 2012-10-11 NOTE — Patient Instructions (Addendum)
Your physician recommends that you schedule a follow-up appointment in: 1 month with Dr. Wyline Mood. This appointment will be scheduled today before you leave.  Your physician has recommended you make the following change in your medication:  Increase Toprol XL 50 MG once daily Increase Midodrine 5 MG three times daily. A prescription for both medications have been sent to your pharmacy.   Continue all other medications the same.   Your physician has requested that you have an echocardiogram. Echocardiography is a painless test that uses sound waves to create images of your heart. It provides your doctor with information about the size and shape of your heart and how well your heart's chambers and valves are working. This procedure takes approximately one hour. There are no restrictions for this procedure.

## 2012-10-17 ENCOUNTER — Other Ambulatory Visit: Payer: Self-pay

## 2012-10-17 ENCOUNTER — Other Ambulatory Visit (INDEPENDENT_AMBULATORY_CARE_PROVIDER_SITE_OTHER): Payer: Medicaid Other

## 2012-10-17 DIAGNOSIS — R002 Palpitations: Secondary | ICD-10-CM

## 2012-10-17 DIAGNOSIS — R0609 Other forms of dyspnea: Secondary | ICD-10-CM

## 2012-10-18 ENCOUNTER — Telehealth: Payer: Self-pay | Admitting: Cardiology

## 2012-10-18 NOTE — Telephone Encounter (Signed)
Pt informed and reminded to keep scheduled appts.

## 2012-10-18 NOTE — Telephone Encounter (Signed)
Message copied by Burnice Logan on Fri Oct 18, 2012  4:04 PM ------      Message from: [place] F      Created: Fri Oct 18, 2012 10:00 AM       Please let patient know that her echo was normal ------

## 2012-11-07 ENCOUNTER — Ambulatory Visit (INDEPENDENT_AMBULATORY_CARE_PROVIDER_SITE_OTHER): Payer: Medicaid Other | Admitting: Cardiology

## 2012-11-07 ENCOUNTER — Other Ambulatory Visit: Payer: Self-pay | Admitting: Cardiology

## 2012-11-07 ENCOUNTER — Encounter: Payer: Self-pay | Admitting: Cardiology

## 2012-11-07 VITALS — BP 120/85 | HR 101 | Ht 67.0 in | Wt 283.0 lb

## 2012-11-07 DIAGNOSIS — R002 Palpitations: Secondary | ICD-10-CM

## 2012-11-07 DIAGNOSIS — R609 Edema, unspecified: Secondary | ICD-10-CM

## 2012-11-07 NOTE — Patient Instructions (Addendum)
Your physician recommends that you schedule a follow-up appointment in: 3 weeks with Dr. Wyline Mood. You will schedule this appointment today before you leave.  Your physician recommends that you continue on your current medications as directed. Please refer to the Current Medication list given to you today.  Your physician recommends that you return for lab work in: today for CMET, TSH, Urine analysis, and Urine protein ceratine ratio  You can go to the following locations to get lab work done: Dr. Sherril Croon office [facility] [place] [address] Dr. Lysbeth Galas office in [facility].    Your physician has recommended that you wear an 14 day event monitor. Event monitors are medical devices that record the heart's electrical activity. Doctors most often [place] these monitors to diagnose arrhythmias. Arrhythmias are problems with the speed or rhythm of the heartbeat. The monitor is a small, portable device. You can wear one while you do your normal daily activities. This is usually used to diagnose what is causing palpitations/syncope (passing out).

## 2012-11-07 NOTE — Progress Notes (Signed)
Clinical Summary Ms. BLACKARD is a 27 y.o.female   1. POTS  - prior history of orthostatic dizziness and neurocardiogenic syncope, had been well controlled w/ beta blocker and midodrine  - palpitations often brought on by manic episodes  - symptoms can occur while sitting. Worst w/ standing. Describe black spots in vision, lightheaded/dizzy. + palps, sometimes feels heart slows down. Increased anxiety. Reports she has passed out before, last episode in June. Episodes occur 3-6 times a month  - compliant metoprolol, midodrine. Drinks water regularly, approx 1 gallon a day per her report. Drinks 1 pot of coffee a day, 8 oz glass of tea.  - last visit increased Toprol XL and midodrine. - no change in symptoms. Continues to feel episodies of lightheadedness and dizziness. Occurs 2-3 times a week. She also has increased her fluid intake as well, has cut back on caffeine.   Past Medical History  Diagnosis Date  . Asthma     No wheezing tolerating beta blocker  . Environmental allergies   . POTS (postural orthostatic tachycardia syndrome)     Improved significantly with low dose beta blocker and proamatine  . Normal echocardiogram     w/no evidence of VSD and normal LV function  . Bipolar disorder      Allergies  Allergen Reactions  . Cephalexin   . Penicillins      Current Outpatient Prescriptions  Medication Sig Dispense Refill  . ALPRAZolam (XANAX) 0.5 MG tablet Take 0.5 mg by mouth 3 (three) times daily as needed for sleep.      . metoprolol succinate (TOPROL-XL) 50 MG 24 hr tablet Take 1 tablet (50 mg total) by mouth daily.  30 tablet  6  . midodrine (PROAMATINE) 5 MG tablet Take 1 tablet (5 mg total) by mouth 3 (three) times daily.  90 tablet  6  . phentermine 37.5 MG capsule Take 37.5 mg by mouth every morning.      . propranolol (INDERAL) 20 MG tablet May take one tab every 12 hours as needed for palpitations / tachycardia  30 tablet  1  . tiZANidine (ZANAFLEX) 4 MG  tablet Take 6 mg by mouth every 6 (six) hours as needed.      . venlafaxine XR (EFFEXOR-XR) 150 MG 24 hr capsule Take 300 mg by mouth daily.       No current facility-administered medications for this visit.     Past Surgical History  Procedure Laterality Date  . Tonsillectomy       Allergies  Allergen Reactions  . Cephalexin   . Penicillins       No family history on file.   Social History Ms. Vaccaro reports that she quit smoking about 3 years ago. Her smoking use included Cigarettes. She smoked 0.00 packs per day. She has never used smokeless tobacco. Ms. LOOMER reports that she does not drink alcohol.   Review of Systems CONSTITUTIONAL: No weight loss, fever, chills, weakness or fatigue.  HEENT: Eyes: No visual loss, blurred vision, double vision or yellow sclerae.No hearing loss, sneezing, congestion, runny nose or sore throat.  SKIN: No rash or itching.  CARDIOVASCULAR: per HPI RESPIRATORY: per HPI.  GASTROINTESTINAL: No anorexia, nausea, vomiting or diarrhea. No abdominal pain or blood.  GENITOURINARY: No burning on urination, no polyuria NEUROLOGICAL: per HPI MUSCULOSKELETAL: No muscle, back pain, joint pain or stiffness.  LYMPHATICS: No enlarged nodes. No history of splenectomy.  PSYCHIATRIC: No history of depression or anxiety.  ENDOCRINOLOGIC: No reports of  sweating, cold or heat intolerance. No polyuria or polydipsia.  [name]   Physical Examination Filed Vitals:   11/07/12 1520  BP: 120/85  Pulse: 101   Filed Weights   11/07/12 1520  Weight: 283 lb (128.368 kg)    Gen: resting comfortably, no acute distress HEENT: no scleral icterus, pupils equal round and reactive, no palptable cervical adenopathy,  CV: RRR, no m/r/g, no JVD Resp: Clear to auscultation bilaterally GI: abdomen is soft, non-tender, non-distended, normal bowel sounds, no hepatosplenomegaly MSK: extremities are warm, no edema.  Skin: warm, no rash Neuro:  no focal deficits Psych:  appropriate affect   Diagnostic Studies 10/17/12 Echo LVEF 60-65%, no WMA,normal diastolic      Assessment and Plan  1. POTS/neurocardiogenic syncope  - symptoms previously well controlled on beta blocker and midodrine, now seem to have returned.  - not improved by increasing her beta blocker and midodrine - will obtain a 14 day event monitor to better evaluate her cardiac rhythm during these episodes - counseled to hydrate w/ fluids w/ electrolytes (powerade, gatorade, etc.) as opposed to just water, cut down on coffee and tea which have too much caffeine and serve as diuretics for her  - counseled and agrees to stop driving until her symptoms are stabilized.    [name], M.D., F.A.C.C.

## 2012-11-12 ENCOUNTER — Telehealth: Payer: Self-pay | Admitting: Cardiology

## 2012-11-12 NOTE — Telephone Encounter (Signed)
Received two separate faxes from [employer] stating that they have been unsuccessful in contacting pt. I have tired contacting pt multiple times and have been unsuccessful as well. Called [employer] and gave them her husbands number to try and contact her through him.

## 2012-11-13 ENCOUNTER — Encounter: Payer: Self-pay | Admitting: Cardiology

## 2012-11-19 DIAGNOSIS — R002 Palpitations: Secondary | ICD-10-CM

## 2012-11-20 ENCOUNTER — Encounter: Payer: Self-pay | Admitting: Cardiology

## 2012-12-02 ENCOUNTER — Telehealth: Payer: Self-pay | Admitting: Cardiology

## 2012-12-02 NOTE — Telephone Encounter (Signed)
Pt called office and confirmed that the event monitored that was order at [facility] on 10-23 was completed. She said that she finished the test today 12-02-12. Pt also stated that she had lab work done at Dr. Sherril Croon office. I will contact the office to get these lab results. I also reminded pt of appt for Thursday 11-20 at 3:00 PM.

## 2012-12-02 NOTE — Telephone Encounter (Signed)
Called Dr. Sherril Croon office and had lab work faxed.

## 2012-12-05 ENCOUNTER — Encounter: Payer: Self-pay | Admitting: Cardiology

## 2012-12-05 ENCOUNTER — Encounter: Payer: Medicaid Other | Admitting: Cardiology

## 2012-12-05 NOTE — Progress Notes (Signed)
Clinical Summary Ms. CALIX is a 27 y.o.female  1. POTS  - prior history of orthostatic dizziness and neurocardiogenic syncope, had been well controlled w/ beta blocker and midodrine  - palpitations often brought on by manic episodes  - symptoms can occur while sitting. Worst w/ standing. Describe black spots in vision, lightheaded/dizzy. + palps, sometimes feels heart slows down. Increased anxiety. Reports she has passed out before, last episode in June. Episodes occur 3-6 times a month  - compliant metoprolol, midodrine. Drinks water regularly, approx 1 gallon a day per her report. Drinks 1 pot of coffee a day, 8 oz glass of tea.  - last visit increased Toprol XL and midodrine.  - no change in symptoms. Continues to feel episodies of lightheadedness and dizziness. Occurs 2-3 times a week. She also has increased her fluid intake as well, has cut back on caffeine.      Past Medical History  Diagnosis Date  . Asthma     No wheezing tolerating beta blocker  . Environmental allergies   . POTS (postural orthostatic tachycardia syndrome)     Improved significantly with low dose beta blocker and proamatine  . Normal echocardiogram     w/no evidence of VSD and normal LV function  . Bipolar disorder      Allergies  Allergen Reactions  . Cephalexin   . Penicillins      Current Outpatient Prescriptions  Medication Sig Dispense Refill  . ALPRAZolam (XANAX) 0.5 MG tablet Take 0.5 mg by mouth 3 (three) times daily as needed for sleep.      . metoprolol succinate (TOPROL-XL) 50 MG 24 hr tablet Take 1 tablet (50 mg total) by mouth daily.  30 tablet  6  . midodrine (PROAMATINE) 5 MG tablet Take 1 tablet (5 mg total) by mouth 3 (three) times daily.  90 tablet  6  . phentermine 37.5 MG capsule Take 37.5 mg by mouth every morning.      . propranolol (INDERAL) 20 MG tablet May take one tab every 12 hours as needed for palpitations / tachycardia  30 tablet  1  . tiZANidine (ZANAFLEX) 4 MG  tablet Take 6 mg by mouth every 6 (six) hours as needed.      . venlafaxine XR (EFFEXOR-XR) 150 MG 24 hr capsule Take 300 mg by mouth daily.       No current facility-administered medications for this visit.     Past Surgical History  Procedure Laterality Date  . Tonsillectomy       Allergies  Allergen Reactions  . Cephalexin   . Penicillins       No family history on file.   Social History Ms. Lett reports that she quit smoking about 3 years ago. Her smoking use included Cigarettes. She smoked 0.00 packs per day. She has never used smokeless tobacco. Ms. FISHER reports that she does not drink alcohol.   Review of Systems CONSTITUTIONAL: No weight loss, fever, chills, weakness or fatigue.  HEENT: Eyes: No visual loss, blurred vision, double vision or yellow sclerae.No hearing loss, sneezing, congestion, runny nose or sore throat.  SKIN: No rash or itching.  CARDIOVASCULAR:  RESPIRATORY: No shortness of breath, cough or sputum.  GASTROINTESTINAL: No anorexia, nausea, vomiting or diarrhea. No abdominal pain or blood.  GENITOURINARY: No burning on urination, no polyuria NEUROLOGICAL: No headache, dizziness, syncope, paralysis, ataxia, numbness or tingling in the extremities. No change in bowel or bladder control.  MUSCULOSKELETAL: No muscle, back pain, joint  pain or stiffness.  LYMPHATICS: No enlarged nodes. No history of splenectomy.  PSYCHIATRIC: No history of depression or anxiety.  ENDOCRINOLOGIC: No reports of sweating, cold or heat intolerance. No polyuria or polydipsia.  [name]   Physical Examination There were no vitals filed for this visit. There were no vitals filed for this visit.  Gen: resting comfortably, no acute distress HEENT: no scleral icterus, pupils equal round and reactive, no palptable cervical adenopathy,  CV Resp: Clear to auscultation bilaterally GI: abdomen is soft, non-tender, non-distended, normal bowel sounds, no hepatosplenomegaly MSK:  extremities are warm, no edema.  Skin: warm, no rash Neuro:  no focal deficits Psych: appropriate affect   Diagnostic Studies 10/17/12 Echo  LVEF 60-65%, no WMA,normal diastolic      Assessment and Plan   1. POTS/neurocardiogenic syncope  - symptoms previously well controlled on beta blocker and midodrine, now seem to have returned.  - not improved by increasing her beta blocker and midodrine  - will obtain a 14 day event monitor to better evaluate her cardiac rhythm during these episodes  - counseled to hydrate w/ fluids w/ electrolytes (powerade, gatorade, etc.) as opposed to just water, cut down on coffee and tea which have too much caffeine and serve as diuretics for her  - counseled and agrees to stop driving until her symptoms are stabilized.         [name], M.D., F.A.C.C.

## 2012-12-09 ENCOUNTER — Telehealth: Payer: Self-pay | Admitting: Cardiology

## 2012-12-09 ENCOUNTER — Encounter: Payer: Self-pay | Admitting: Cardiology

## 2012-12-09 NOTE — Telephone Encounter (Signed)
Message copied by Burnice Logan on Mon Dec 09, 2012  9:25 AM ------      Message from: Dina Rich F      Created: Tue Dec 03, 2012 12:52 PM       Please let patient know her labs were normal ------

## 2012-12-09 NOTE — Telephone Encounter (Signed)
Mailed letter to pt

## 2012-12-24 ENCOUNTER — Encounter: Payer: Self-pay | Admitting: Cardiology

## 2013-06-13 NOTE — Progress Notes (Signed)
This encounter was created in error - please disregard.

## 2013-12-16 ENCOUNTER — Other Ambulatory Visit: Payer: Self-pay | Admitting: Cardiology

## 2013-12-16 ENCOUNTER — Telehealth: Payer: Self-pay | Admitting: Cardiology

## 2013-12-16 NOTE — Telephone Encounter (Signed)
#  30 Take 1 tablet by mouth once daily.

## 2014-01-29 ENCOUNTER — Encounter: Payer: Self-pay | Admitting: Cardiology

## 2014-01-29 ENCOUNTER — Ambulatory Visit (INDEPENDENT_AMBULATORY_CARE_PROVIDER_SITE_OTHER): Payer: Medicaid Other | Admitting: Cardiology

## 2014-01-29 VITALS — BP 127/83 | HR 86 | Ht 67.0 in | Wt 277.0 lb

## 2014-01-29 DIAGNOSIS — G90A Postural orthostatic tachycardia syndrome (POTS): Secondary | ICD-10-CM

## 2014-01-29 DIAGNOSIS — R002 Palpitations: Secondary | ICD-10-CM

## 2014-01-29 DIAGNOSIS — G4733 Obstructive sleep apnea (adult) (pediatric): Secondary | ICD-10-CM

## 2014-01-29 DIAGNOSIS — I951 Orthostatic hypotension: Secondary | ICD-10-CM

## 2014-01-29 DIAGNOSIS — R000 Tachycardia, unspecified: Secondary | ICD-10-CM

## 2014-01-29 DIAGNOSIS — R55 Syncope and collapse: Secondary | ICD-10-CM

## 2014-01-29 MED ORDER — PROPRANOLOL HCL 20 MG PO TABS: ORAL_TABLET | ORAL | Status: AC

## 2014-01-29 NOTE — Progress Notes (Signed)
Clinical Summary Ms. DEWALT is a 29 y.o.female seen today for follow up of the following medical problems.   1. POTS  - over the last month reports episodes of palpitations at homes. Reports episode of syncope about 2 weeks ago. Episodes occuring approximately 2-3 times a week. +dizziness. Can often come on with getting upset. Has had some manic episodes which have often been the trigger in the past. Of note she is also currently on phentermine for weight loss, and also started on HCTZ by her pcp.  - coffee 3-4 cups a day, no tea, + mountain dews 1 20 oz, no EtoH, no energy drinks.    2. OSA screen - + snoring, + daytime somnolence. + apneic episodes  Past Medical History  Diagnosis Date  . Asthma     No wheezing tolerating beta blocker  . Environmental allergies   . POTS (postural orthostatic tachycardia syndrome)     Improved significantly with low dose beta blocker and proamatine  . Normal echocardiogram     w/no evidence of VSD and normal LV function  . Bipolar disorder      Allergies  Allergen Reactions  . Cephalexin   . Penicillins      Current Outpatient Prescriptions  Medication Sig Dispense Refill  . ALPRAZolam (XANAX) 0.5 MG tablet Take 0.5 mg by mouth 3 (three) times daily as needed for sleep.    . midodrine (PROAMATINE) 5 MG tablet Take 1 tablet (5 mg total) by mouth 3 (three) times daily. 90 tablet 6  . phentermine 37.5 MG capsule Take 37.5 mg by mouth every morning.    . propranolol (INDERAL) 20 MG tablet May take one tab every 12 hours as needed for palpitations / tachycardia 30 tablet 1  . tiZANidine (ZANAFLEX) 4 MG tablet Take 6 mg by mouth every 6 (six) hours as needed.    . TOPROL XL 50 MG 24 hr tablet TAKE (1) TABLET BY MOUTH ONCE DAILY. 15 tablet 0  . venlafaxine XR (EFFEXOR-XR) 150 MG 24 hr capsule Take 300 mg by mouth daily.     No current facility-administered medications for this visit.     Past Surgical History  Procedure Laterality  Date  . Tonsillectomy       Allergies  Allergen Reactions  . Cephalexin   . Penicillins       No family history on file.   Social History Ms. Lobue reports that she quit smoking about 4 years ago. Her smoking use included Cigarettes. She has never used smokeless tobacco. Ms. LEINER reports that she does not drink alcohol.   Review of Systems CONSTITUTIONAL: No weight loss, fever, chills, weakness or fatigue.  HEENT: Eyes: No visual loss, blurred vision, double vision or yellow sclerae.No hearing loss, sneezing, congestion, runny nose or sore throat.  SKIN: No rash or itching.  CARDIOVASCULAR: per HPI RESPIRATORY: No shortness of breath, cough or sputum.  GASTROINTESTINAL: No anorexia, nausea, vomiting or diarrhea. No abdominal pain or blood.  GENITOURINARY: No burning on urination, no polyuria NEUROLOGICAL: per HPI MUSCULOSKELETAL: No muscle, back pain, joint pain or stiffness.  LYMPHATICS: No enlarged nodes. No history of splenectomy.  PSYCHIATRIC: No history of depression or anxiety.  ENDOCRINOLOGIC: No reports of sweating, cold or heat intolerance. No polyuria or polydipsia.  [name]   Physical Examination p 86 bp 127/83 Wt 277 lbs BMI 43 Gen: resting comfortably, no acute distress HEENT: no scleral icterus, pupils equal round and reactive, no palptable cervical adenopathy,  CV: RRR, no m/r/g, no JVD Resp: Clear to auscultation bilaterally GI: abdomen is soft, non-tender, non-distended, normal bowel sounds, no hepatosplenomegaly MSK: extremities are warm, no edema.  Skin: warm, no rash Neuro:  no focal deficits Psych: appropriate affect   Diagnostic Studies 10/17/12 Echo LVEF 60-65%, no WMA,normal diastolic       Assessment and Plan  1. POTS/neurocardiogenic syncope  - worsening symptoms. Likely related to combination of being on phentermine, HCTZ, and continued high caffeine intake - will stop her phentermine and HCTZ, advised to further decrease caffeine  intake. Continue to maintain adequate hydration and high sodium intake.  - if these changes don't improve symptoms likely uptitrate her beta blocker next visit - refill her prn propanolol  2. OSA screen - signs and symptoms concerning for OSA, also another potential factor that could be exacerbating arrhythmias - will refer to sleep medicine   F/u 6 weeks    Antoine PocheJonathan F. Payden Docter, M.D.,

## 2014-01-29 NOTE — Patient Instructions (Addendum)
   Stop Phentermine  Stop HCTZ  Propranolol refilled Continue all other medications.   Referral to Dr. Andrey CampanileWilson  Follow up in  6 weeks

## 2014-02-05 ENCOUNTER — Ambulatory Visit (HOSPITAL_COMMUNITY)
Admission: RE | Admit: 2014-02-05 | Discharge: 2014-02-05 | Disposition: A | Discharge status: Home / Self Care | Payer: Disability Insurance | Source: Ambulatory Visit | Attending: Family Medicine | Admitting: Family Medicine

## 2014-02-05 ENCOUNTER — Other Ambulatory Visit (HOSPITAL_COMMUNITY): Payer: Self-pay | Admitting: Family Medicine

## 2014-02-05 DIAGNOSIS — M5441 Lumbago with sciatica, right side: Secondary | ICD-10-CM

## 2014-02-17 ENCOUNTER — Other Ambulatory Visit: Payer: Self-pay | Admitting: Cardiology

## 2014-03-16 ENCOUNTER — Encounter: Payer: Medicaid Other | Admitting: Cardiology

## 2014-03-16 ENCOUNTER — Encounter: Payer: Self-pay | Admitting: Cardiology

## 2014-03-16 NOTE — Progress Notes (Signed)
   ERROR No Show     Chealsea Paske F. Butler Vegh, M.D.  

## 2015-04-15 ENCOUNTER — Ambulatory Visit: Payer: Disability Insurance | Admitting: Cardiology

## 2015-05-21 ENCOUNTER — Ambulatory Visit: Payer: Disability Insurance | Admitting: Cardiology

## 2015-05-21 ENCOUNTER — Encounter: Payer: Self-pay | Admitting: Cardiology

## 2015-05-21 NOTE — Progress Notes (Unsigned)
Patient ID: Suzanne Chambers, female   DOB: July 17, 1985, 30 y.o.   MRN: [id number]     Clinical Summary [name] is a 30 y.o.female seen today for follow up of the following medical problems.    1. POTS  - over the last month reports episodes of palpitations at homes. Reports episode of syncope about 2 weeks ago. Episodes occuring approximately 2-3 times a week. +dizziness. Can often come on with getting upset. Has had some manic episodes which have often been the trigger in the past. Of note she is also currently on phentermine for weight loss, and also started on HCTZ by her pcp.  - coffee 3-4 cups a day, no tea, + mountain dews 1 20 oz, no EtoH, no energy drinks.    2. OSA screen - + snoring, + daytime somnolence. + apneic episodes Past Medical History  Diagnosis Date  . Asthma     No wheezing tolerating beta blocker  . Environmental allergies   . POTS (postural orthostatic tachycardia syndrome)     Improved significantly with low dose beta blocker and proamatine  . Normal echocardiogram     w/no evidence of VSD and normal LV function  . Bipolar disorder      Allergies  Allergen Reactions  . Cephalexin   . Penicillins   . Amoxicillin Hives and Nausea And Vomiting     Current Outpatient Prescriptions  Medication Sig Dispense Refill  . ALPRAZolam (XANAX) 0.5 MG tablet Take 0.5 mg by mouth 3 (three) times daily as needed for sleep.    . ARIPiprazole (ABILIFY) 10 MG tablet Take 10 mg by mouth daily.    . metoprolol succinate (TOPROL-XL) 50 MG 24 hr tablet TAKE 1 TABLET BY MOUTH ONCE DAILY. PATIENT NEEDS TO BE SEEN. 30 tablet 6  . midodrine (PROAMATINE) 5 MG tablet Take 1 tablet (5 mg total) by mouth 3 (three) times daily. 90 tablet 6  . oxyCODONE-acetaminophen (PERCOCET/ROXICET) 5-325 MG per tablet Take 1 tablet by mouth every 6 (six) hours as needed for severe pain.    [name]. PARoxetine (PAXIL) 40 MG tablet Take 40 mg by mouth every morning.    . propranolol (INDERAL) 20 MG  tablet May take one tab every 12 hours as needed for palpitations / tachycardia 30 tablet 2   No current facility-administered medications for this visit.     Past Surgical History  Procedure Laterality Date  . Tonsillectomy       Allergies  Allergen Reactions  . Cephalexin   . Penicillins   . Amoxicillin Hives and Nausea And Vomiting      No family history on file.   Social History [name] reports that she has quit smoking. Her smoking use included Cigarettes. She started smoking about 10 years ago. She has a 30 pack-year smoking history. She has never used smokeless tobacco. [name] reports that she does not drink alcohol.   Review of Systems CONSTITUTIONAL: No weight loss, fever, chills, weakness or fatigue.  HEENT: Eyes: No visual loss, blurred vision, double vision or yellow sclerae.No hearing loss, sneezing, congestion, runny nose or sore throat.  SKIN: No rash or itching.  CARDIOVASCULAR:  RESPIRATORY: No shortness of breath, cough or sputum.  GASTROINTESTINAL: No anorexia, nausea, vomiting or diarrhea. No abdominal pain or blood.  GENITOURINARY: No burning on urination, no polyuria NEUROLOGICAL: No headache, dizziness, syncope, paralysis, ataxia, numbness or tingling in the extremities. No change in bowel or bladder control.  MUSCULOSKELETAL: No muscle, back pain, joint  pain or stiffness.  LYMPHATICS: No enlarged nodes. No history of splenectomy.  PSYCHIATRIC: No history of depression or anxiety.  ENDOCRINOLOGIC: No reports of sweating, cold or heat intolerance. No polyuria or polydipsia.  [name]   Physical Examination There were no vitals filed for this visit. There were no vitals filed for this visit.  Gen: resting comfortably, no acute distress HEENT: no scleral icterus, pupils equal round and reactive, no palptable cervical adenopathy,  CV Resp: Clear to auscultation bilaterally GI: abdomen is soft, non-tender, non-distended, normal bowel sounds, no  hepatosplenomegaly MSK: extremities are warm, no edema.  Skin: warm, no rash Neuro:  no focal deficits Psych: appropriate affect   Diagnostic Studies 10/17/12 Echo LVEF 60-65%, no WMA,normal diastolic     Assessment and Plan   1. POTS/neurocardiogenic syncope  - worsening symptoms. Likely related to combination of being on phentermine, HCTZ, and continued high caffeine intake - will stop her phentermine and HCTZ, advised to further decrease caffeine intake. Continue to maintain adequate hydration and high sodium intake.  - if these changes don't improve symptoms likely uptitrate her beta blocker next visit - refill her prn propanolol  2. OSA screen - signs and symptoms concerning for OSA, also another potential factor that could be exacerbating arrhythmias - will refer to sleep medicine     [name], M.D., F.A.C.C.

## 2016-02-14 ENCOUNTER — Ambulatory Visit: Payer: Disability Insurance | Admitting: Cardiology

## 2016-02-14 NOTE — Progress Notes (Deleted)
Clinical Summary Suzanne Chambers is a 31 y.o.female seen today for follow up of the following medical problems.   1. POTS  - over the last month reports episodes of palpitations at homes. Reports episode of syncope about 2 weeks ago. Episodes occuring approximately 2-3 times a week. +dizziness. Can often come on with getting upset. Has had some manic episodes which have often been the trigger in the past. Of note she is also currently on phentermine for weight loss, and also started on HCTZ by her pcp.  - coffee 3-4 cups a day, no tea, + mountain dews 1 20 oz, no EtoH, no energy drinks.    2. OSA screen - + snoring, + daytime somnolence. + apneic episodes  - last visit referred to sleep medicine.   Past Medical History:  Diagnosis Date  . Asthma    No wheezing tolerating beta blocker  . Bipolar disorder   . Environmental allergies   . Normal echocardiogram    w/no evidence of VSD and normal LV function  . POTS (postural orthostatic tachycardia syndrome)    Improved significantly with low dose beta blocker and proamatine     Allergies  Allergen Reactions  . Cephalexin   . Penicillins   . Amoxicillin Hives and Nausea And Vomiting     Current Outpatient Prescriptions  Medication Sig Dispense Refill  . ALPRAZolam (XANAX) 0.5 MG tablet Take 0.5 mg by mouth 3 (three) times daily as needed for sleep.    . ARIPiprazole (ABILIFY) 10 MG tablet Take 10 mg by mouth daily.    . metoprolol succinate (TOPROL-XL) 50 MG 24 hr tablet TAKE 1 TABLET BY MOUTH ONCE DAILY. PATIENT NEEDS TO BE SEEN. 30 tablet 6  . midodrine (PROAMATINE) 5 MG tablet Take 1 tablet (5 mg total) by mouth 3 (three) times daily. 90 tablet 6  . oxyCODONE-acetaminophen (PERCOCET/ROXICET) 5-325 MG per tablet Take 1 tablet by mouth every 6 (six) hours as needed for severe pain.    [name] PARoxetine (PAXIL) 40 MG tablet Take 40 mg by mouth every morning.    . propranolol (INDERAL) 20 MG tablet May take one tab every 12  hours as needed for palpitations / tachycardia 30 tablet 2   No current facility-administered medications for this visit.      Past Surgical History:  Procedure Laterality Date  . TONSILLECTOMY       Allergies  Allergen Reactions  . Cephalexin   . Penicillins   . Amoxicillin Hives and Nausea And Vomiting      No family history on file.   Social History Suzanne Chambers reports that she has quit smoking. Her smoking use included Cigarettes. She started smoking about 11 years ago. She has a 30.00 pack-year smoking history. She has never used smokeless tobacco. Suzanne Chambers reports that she does not drink alcohol.   Review of Systems CONSTITUTIONAL: No weight loss, fever, chills, weakness or fatigue.  HEENT: Eyes: No visual loss, blurred vision, double vision or yellow sclerae.No hearing loss, sneezing, congestion, runny nose or sore throat.  SKIN: No rash or itching.  CARDIOVASCULAR:  RESPIRATORY: No shortness of breath, cough or sputum.  GASTROINTESTINAL: No anorexia, nausea, vomiting or diarrhea. No abdominal pain or blood.  GENITOURINARY: No burning on urination, no polyuria NEUROLOGICAL: No headache, dizziness, syncope, paralysis, ataxia, numbness or tingling in the extremities. No change in bowel or bladder control.  MUSCULOSKELETAL: No muscle, back pain, joint pain or stiffness.  LYMPHATICS: No enlarged nodes. No history  of splenectomy.  PSYCHIATRIC: No history of depression or anxiety.  ENDOCRINOLOGIC: No reports of sweating, cold or heat intolerance. No polyuria or polydipsia.  [name].   Physical Examination There were no vitals filed for this visit. There were no vitals filed for this visit.  Gen: resting comfortably, no acute distress HEENT: no scleral icterus, pupils equal round and reactive, no palptable cervical adenopathy,  CV Resp: Clear to auscultation bilaterally GI: abdomen is soft, non-tender, non-distended, normal bowel sounds, no hepatosplenomegaly MSK:  extremities are warm, no edema.  Skin: warm, no rash Neuro:  no focal deficits Psych: appropriate affect   Diagnostic Studies 10/17/12 Echo LVEF 60-65%, no WMA,normal diastolic     Assessment and Plan  1. POTS/neurocardiogenic syncope  - worsening symptoms. Likely related to combination of being on phentermine, HCTZ, and continued high caffeine intake - will stop her phentermine and HCTZ, advised to further decrease caffeine intake. Continue to maintain adequate hydration and high sodium intake.  - if these changes don't improve symptoms likely uptitrate her beta blocker next visit - refill her prn propanolol  2. OSA screen - signs and symptoms concerning for OSA, also another potential factor that could be exacerbating arrhythmias - will refer to sleep medicine   F/u 6 weeks      Antoine PocheJonathan F. Samuel Mcpeek, M.D., F.A.C.C.

## 2016-09-16 IMAGING — DX DG LUMBAR SPINE 2-3V
3 series · 3 of 3 positions shown · non-contrast
Comparison: 01/30/2011

CLINICAL DATA: Midline low back pain with RIGHT sciatica for 4
years, disability determination

EXAM:
LUMBAR SPINE - 2-3 VIEW

[l-spine ap]
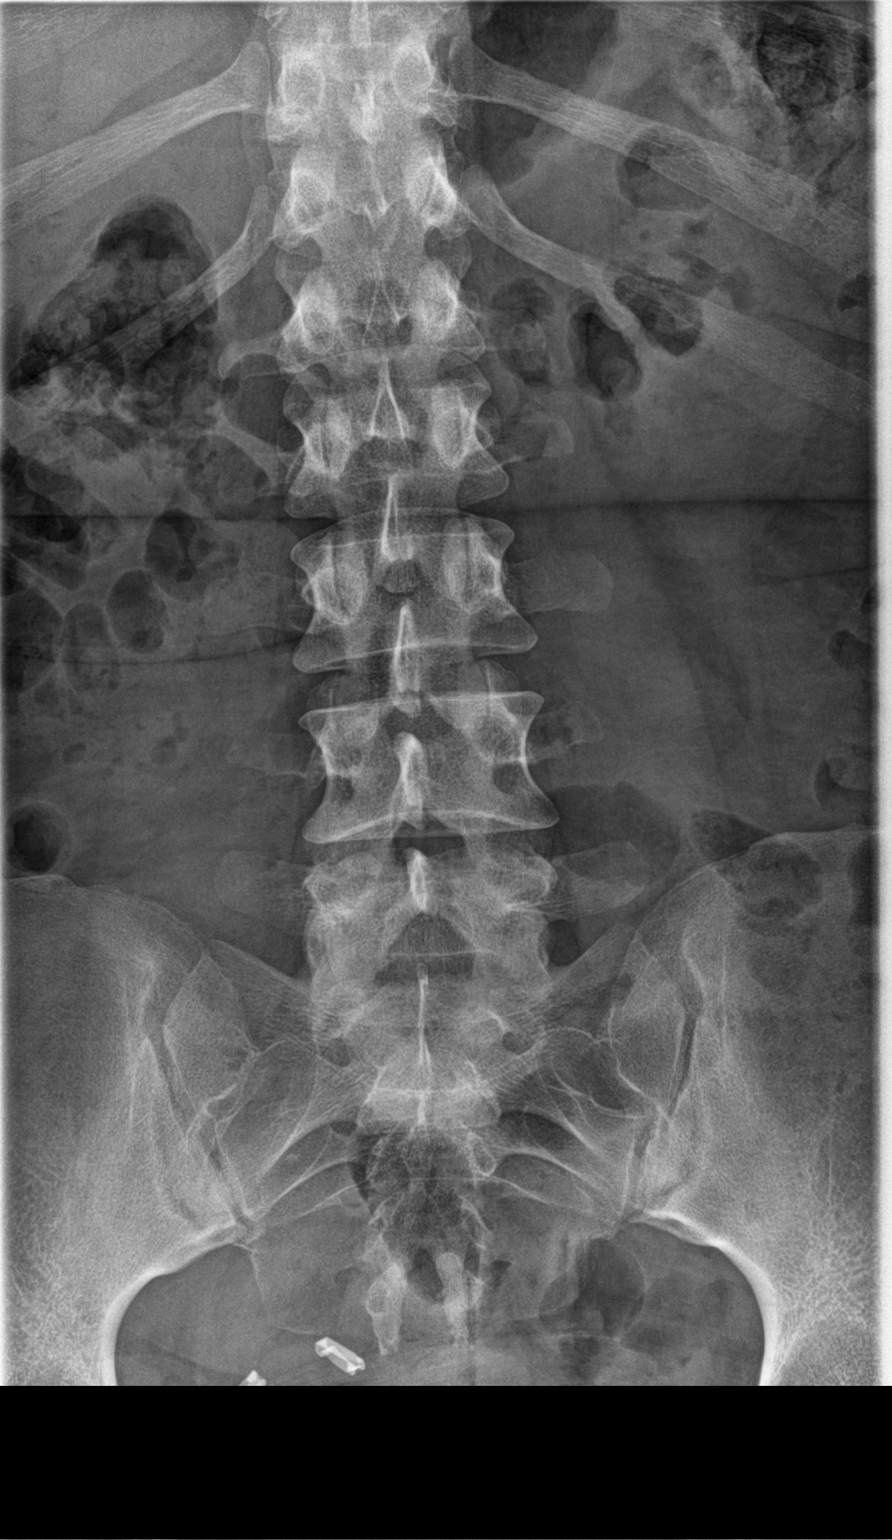

[l-spine lat]
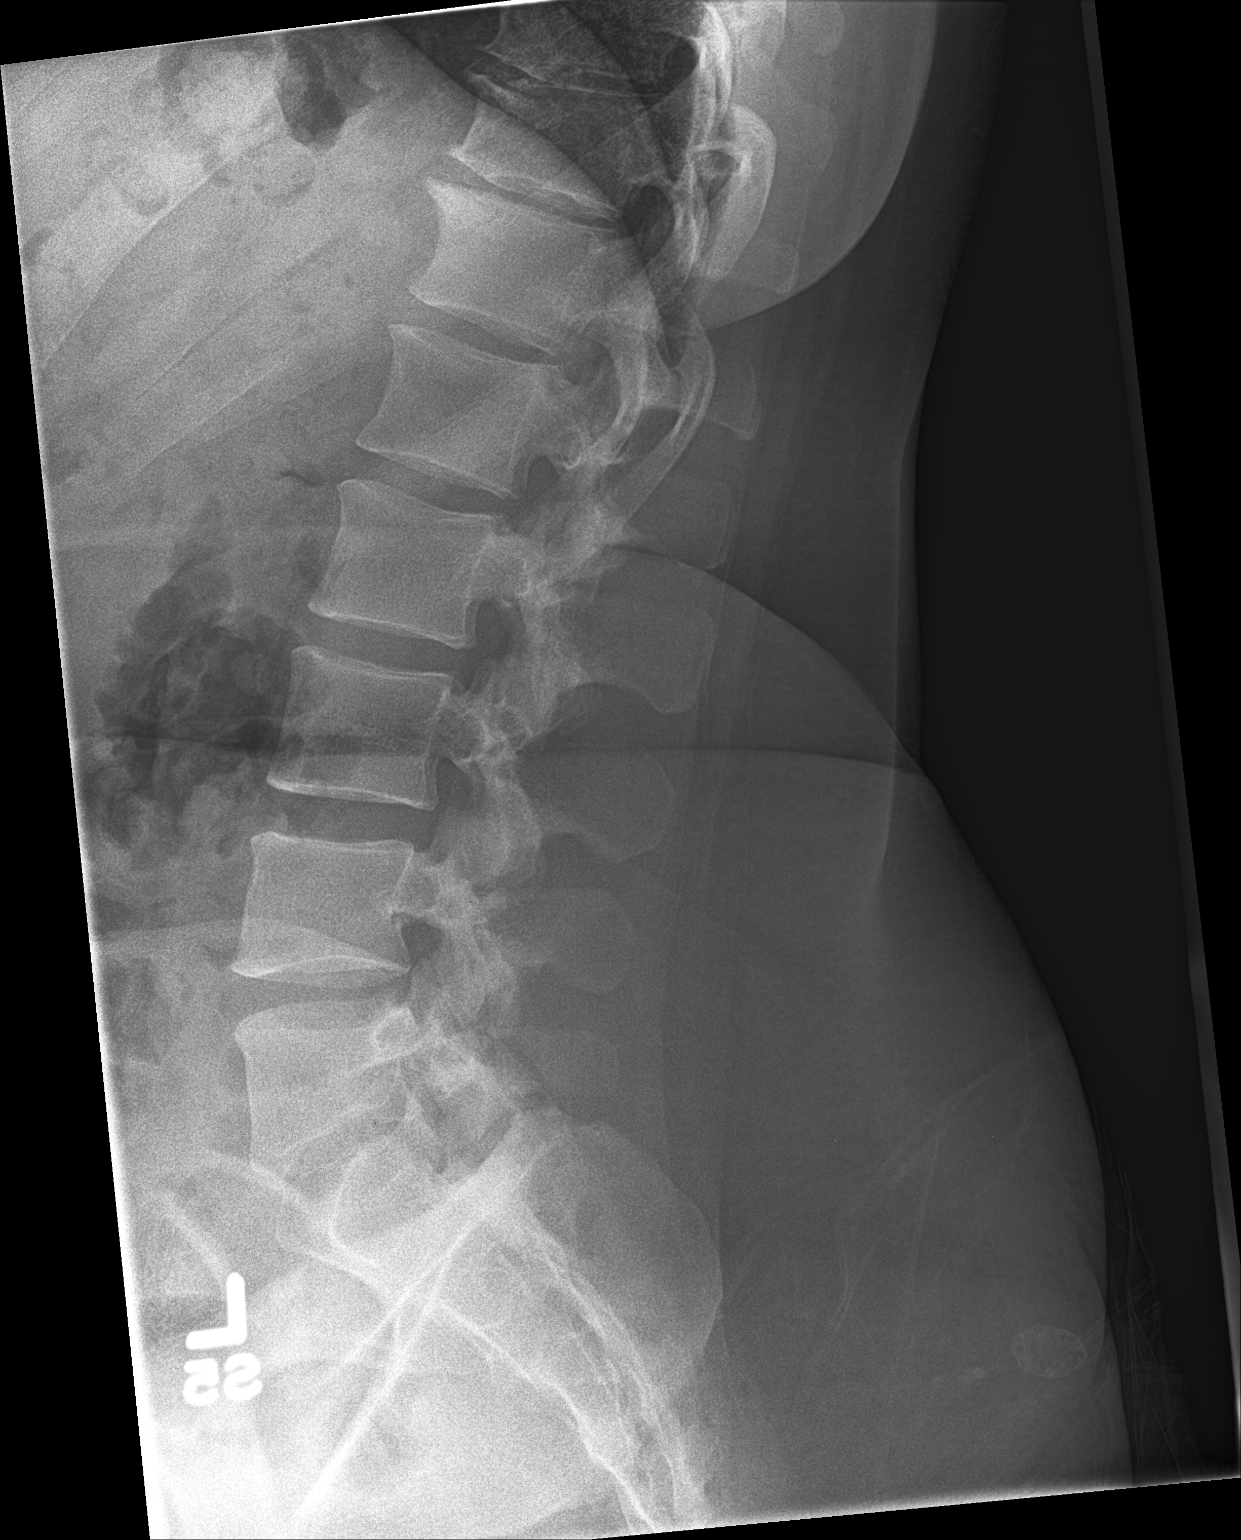

[l-spine spot]
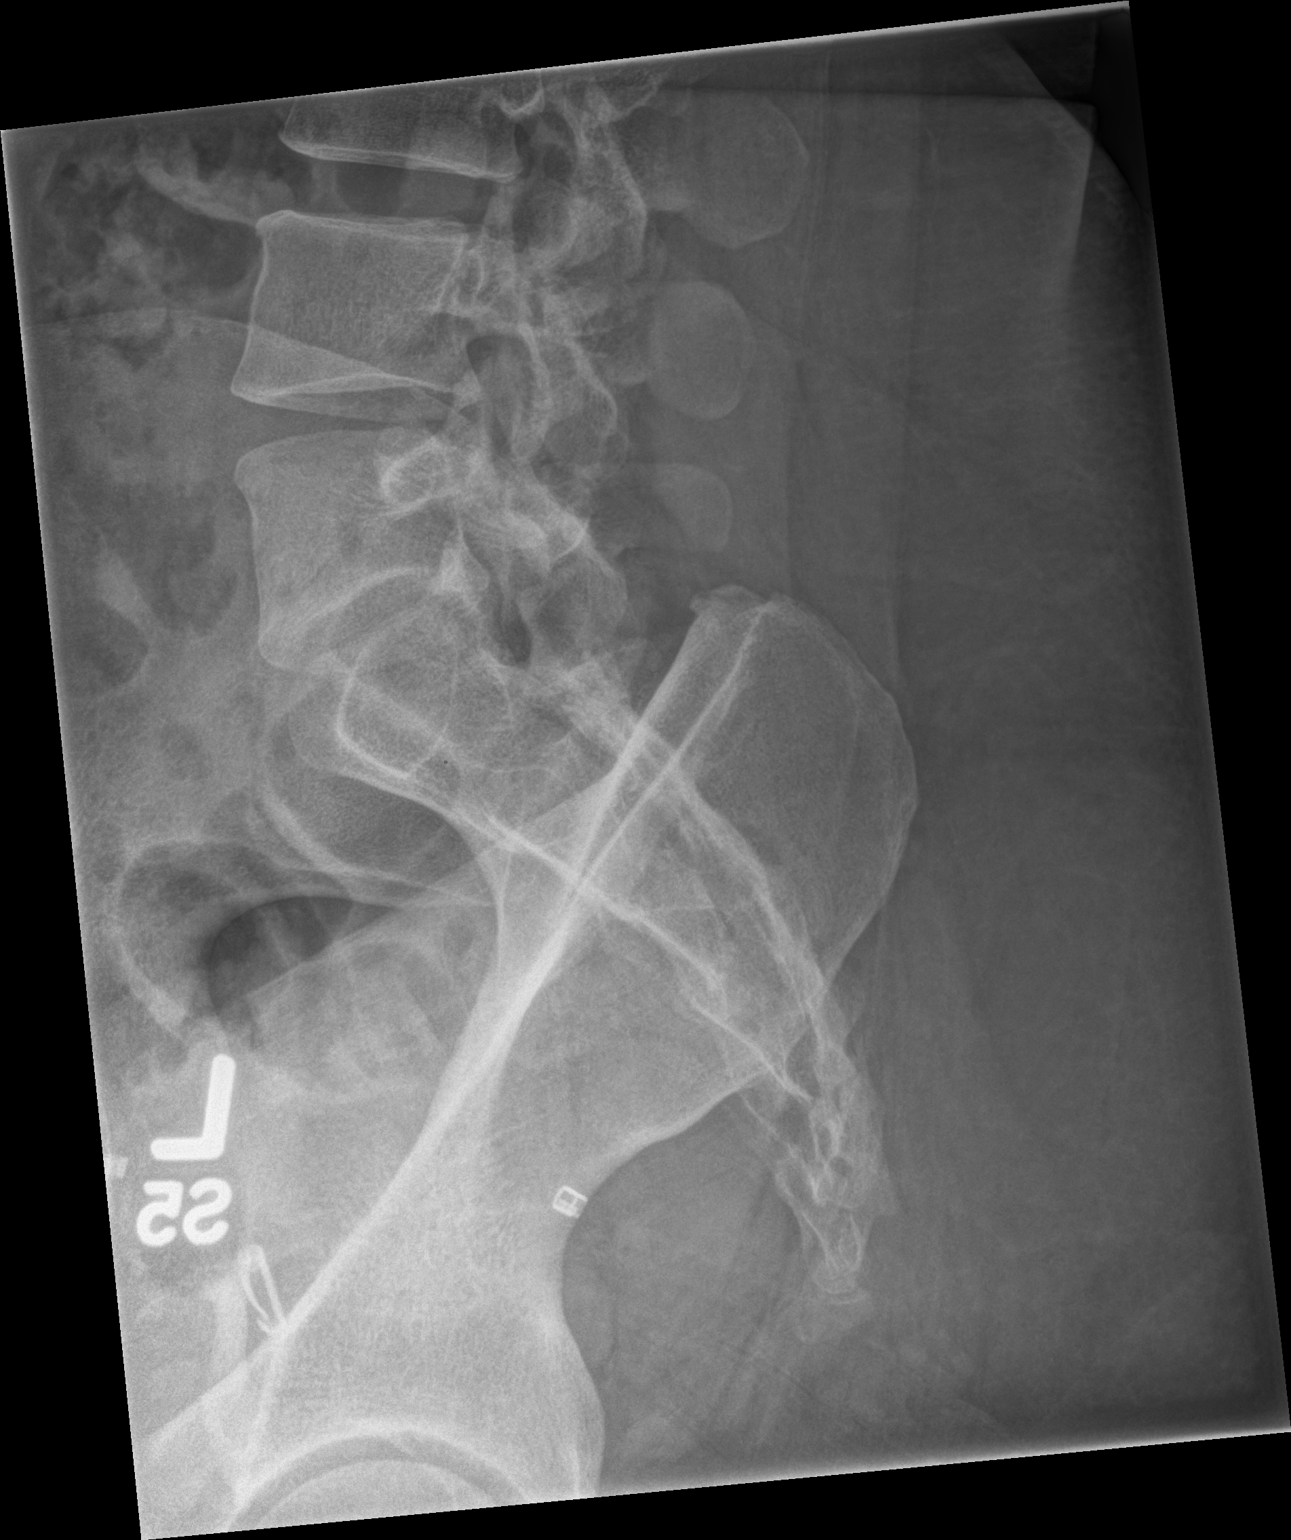

[3 of 3 positions shown; findings below may reference images not displayed]

FINDINGS: Five non-rib-bearing lumbar vertebrae.

Vertebral body and disc space heights maintained within lumbar
spine.

Disc space narrowing with endplate spur formation at T11-T12.

Minimal chronic anterior height loss of T11 vertebra unchanged.

No acute fracture, subluxation or bone destruction.

No spondylolysis.

SI joints symmetric.
IMPRESSION: Degenerative disc disease changes at T11-T12.

No acute lumbar spine abnormalities.

## 2017-08-29 ENCOUNTER — Encounter: Payer: Self-pay | Admitting: Cardiology

## 2017-08-29 ENCOUNTER — Ambulatory Visit: Payer: Disability Insurance | Admitting: Cardiology
